# Patient Record
Sex: Male | Born: 1990 | Hispanic: Yes | Marital: Married
Health system: Southern US, Community
[De-identification: ages and names within clinical notes are randomized; demographics above are authoritative.]

## PROBLEM LIST (undated history)

## (undated) DIAGNOSIS — E119 Type 2 diabetes mellitus without complications: Secondary | ICD-10-CM

---

## 2019-09-19 ENCOUNTER — Emergency Department (HOSPITAL_COMMUNITY)
Admission: EM | Admit: 2019-09-19 | Discharge: 2019-09-19 | Disposition: A | Discharge status: Home / Self Care | Payer: Managed Care, Other (non HMO) | Attending: Emergency Medicine | Admitting: Emergency Medicine

## 2019-09-19 ENCOUNTER — Emergency Department (HOSPITAL_COMMUNITY): Payer: Managed Care, Other (non HMO)

## 2019-09-19 ENCOUNTER — Other Ambulatory Visit: Payer: Self-pay

## 2019-09-19 ENCOUNTER — Encounter (HOSPITAL_COMMUNITY): Payer: Self-pay

## 2019-09-19 DIAGNOSIS — Y9389 Activity, other specified: Secondary | ICD-10-CM | POA: Diagnosis not present

## 2019-09-19 DIAGNOSIS — W108XXA Fall (on) (from) other stairs and steps, initial encounter: Secondary | ICD-10-CM | POA: Diagnosis not present

## 2019-09-19 DIAGNOSIS — E119 Type 2 diabetes mellitus without complications: Secondary | ICD-10-CM | POA: Insufficient documentation

## 2019-09-19 DIAGNOSIS — M79632 Pain in left forearm: Secondary | ICD-10-CM | POA: Insufficient documentation

## 2019-09-19 DIAGNOSIS — R200 Anesthesia of skin: Secondary | ICD-10-CM | POA: Diagnosis not present

## 2019-09-19 DIAGNOSIS — M545 Low back pain, unspecified: Secondary | ICD-10-CM

## 2019-09-19 DIAGNOSIS — Y92038 Other place in apartment as the place of occurrence of the external cause: Secondary | ICD-10-CM | POA: Insufficient documentation

## 2019-09-19 DIAGNOSIS — Y999 Unspecified external cause status: Secondary | ICD-10-CM | POA: Insufficient documentation

## 2019-09-19 DIAGNOSIS — R202 Paresthesia of skin: Secondary | ICD-10-CM | POA: Insufficient documentation

## 2019-09-19 DIAGNOSIS — M25522 Pain in left elbow: Secondary | ICD-10-CM | POA: Diagnosis not present

## 2019-09-19 HISTORY — DX: Type 2 diabetes mellitus without complications: E11.9

## 2019-09-19 MED ORDER — METHOCARBAMOL 500 MG PO TABS: 500.0000 mg | ORAL_TABLET | Freq: Two times a day (BID) | ORAL | 0 refills | Status: AC

## 2019-09-19 MED ORDER — NAPROXEN 500 MG PO TABS: 500.0000 mg | ORAL_TABLET | Freq: Two times a day (BID) | ORAL | 0 refills | Status: AC

## 2019-09-19 MED ORDER — LIDOCAINE 5 % EX PTCH
2.0000 | MEDICATED_PATCH | CUTANEOUS | Status: DC
  Administered 2019-09-19: 2 via TRANSDERMAL
  Filled 2019-09-19: qty 2

## 2019-09-19 NOTE — Discharge Instructions (Signed)
As discussed, all of your x-rays were normal today. I would suspect you injured some of your muscles. I am sending you home with a muscle relaxer called Robaxin. This medicine can cause drowsiness, so do not drive or operate machinery while on the medication. I am also sending you home with Naproxen. You can take it twice a day as needed for pain. Do not mix with other over the counter medication. You may also buy over the counter Voltaren gel and lidoderm patches. Follow-up with you PCP if symptoms do not improve within the next week. Return to the ER for new or worsening symptoms.

## 2019-09-19 NOTE — ED Notes (Signed)
Discharge instructions and prescription discussed with Pt. Pt verbalized understanding. Pt stable and ambulatory.    

## 2019-09-19 NOTE — ED Provider Notes (Signed)
[facility] Provider Note   CSN: [id number] Arrival date & time: 09/19/19  [id number]     History No chief complaint on file.   Tony Medina is a 28 y.o. male with a past medical history significant for diabetes mellitus who presents to the ED after falling down roughly 20 stairs just prior to arrival.  Patient notes he was leaving his apartment and slipped on the icy stairs and slide on the left side of his body roughly 20 steps.  Patient notes he wrapped his arm around his head.  Patient denies head injury and loss of consciousness.  Patient is not on any blood thinners.  Patient notes after the accident he was able to ambulate at the scene and was pain-free for 15 to 30 minutes following the accident.  Patient admits to left elbow and forearm pain associated with mild numbness and tingling.  Patient also admits to left-sided low back pain.  Patient denies numbness and tingling of lower extremities, saddle paresthesias, and bowel/bladder incontinence.  Patient rates his pain a 10/10 worse with movement.  Patient has not tried anything for pain prior to arrival.  Patient denies chest pain, shortness of breath, fever, and chills  Past Medical History:  Diagnosis Date  . Diabetes mellitus without complication (HCC)     There are no problems to display for this patient.   History reviewed. No pertinent surgical history.     No family history on file.  Social History   Tobacco Use  . Smoking status: Not on file  Substance Use Topics  . Alcohol use: Not on file  . Drug use: Not on file    Home Medications Prior to Admission medications   Medication Sig Start Date End Date Taking? Authorizing Provider  methocarbamol (ROBAXIN) 500 MG tablet Take 1 tablet (500 mg total) by mouth 2 (two) times daily. 09/19/19   Mannie StabileAberman, Shantell Belongia C, PA-C  naproxen (NAPROSYN) 500 MG tablet Take 1 tablet (500 mg total) by mouth 2 (two) times daily. 09/19/19    Mannie StabileAberman, Kairah Leoni C, PA-C    Allergies    Patient has no known allergies.  Review of Systems   Review of Systems  Constitutional: Negative for chills and fever.  Eyes: Negative for visual disturbance.  Respiratory: Negative for shortness of breath.   Cardiovascular: Negative for chest pain.  Gastrointestinal: Negative for abdominal pain, diarrhea, nausea and vomiting.  Musculoskeletal: Positive for arthralgias, back pain, joint swelling and myalgias. Negative for gait problem, neck pain and neck stiffness.  Neurological: Positive for numbness. Negative for dizziness, weakness, light-headedness and headaches.    Physical Exam Updated Vital Signs BP 116/76   Pulse 95   Temp 98 F (36.7 C) (Oral)   Resp 14   SpO2 96%   Physical Exam Vitals and nursing note reviewed.  Constitutional:      General: He is not in acute distress.    Appearance: He is not ill-appearing.  HENT:     Head: Normocephalic.  Eyes:     Pupils: Pupils are equal, round, and reactive to light.  Neck:     Comments: No cervical midline tenderness. Full ROM of neck Cardiovascular:     Rate and Rhythm: Normal rate and regular rhythm.     Pulses: Normal pulses.     Heart sounds: Normal heart sounds. No murmur. No friction rub. No gallop.   Pulmonary:     Effort: Pulmonary effort is normal.     Breath  sounds: Normal breath sounds.  Abdominal:     General: Abdomen is flat. There is no distension.     Palpations: Abdomen is soft.     Tenderness: There is no abdominal tenderness. There is no guarding or rebound.  Musculoskeletal:     Cervical back: Normal range of motion and neck supple.     Comments: No T-spine midline tenderness, no stepoff or deformity, mild lumbar midline tenderness, but more significant left-sided lumbar reproducible paraspinal tenderness No leg edema bilaterally Patient moves all extremities without difficulty. DP/PT pulses 2+ and equal bilaterally Sensation grossly intact  bilaterally Strength of knee flexion and extension is 5/5 Plantar and dorsiflexion of ankle 5/5 Achilles and patellar reflexes present and equal Able to ambulate without difficulty  Left arm: tenderness to palpation over left lateral epicondyle and olecranon process with mild edema. Limited ROM due to pain. Mild tenderness over proximal portion of tibia/fibula. Soft compartments. Radial pulse intact.   Skin:    Comments: Small area of erythema over left thigh  Neurological:     General: No focal deficit present.     Mental Status: He is alert.     Comments: Speech is clear, able to follow commands CN III-XII intact Normal strength in upper and lower extremities bilaterally including dorsiflexion and plantar flexion, strong and equal grip strength Sensation grossly intact throughout Moves extremities without ataxia, coordination intact No pronator drift Ambulates without difficulty      ED Results / Procedures / Treatments   Labs (all labs ordered are listed, but only abnormal results are displayed) Labs Reviewed - No data to display  EKG None  Radiology DG Lumbar Spine Complete  Result Date: 09/19/2019 CLINICAL DATA:  Pain following fall EXAM: LUMBAR SPINE - COMPLETE 4+ VIEW COMPARISON:  None. FINDINGS: Frontal, lateral, spot lumbosacral lateral, and bilateral oblique views were obtained. There are 5 non-rib-bearing lumbar type vertebral bodies. There is no fracture or spondylolisthesis. There is mild disc space narrowing and L4-5 and L5-S1. Other disc spaces appear normal. There is an apparent Schmorl's node along the superior aspect of the L5 vertebral body. There is no appreciable facet arthropathy on the oblique views. IMPRESSION: Mild disc space narrowing at L4-5 and L5-S1. No other arthropathic change. No fracture or spondylolisthesis. Electronically Signed   By: Bretta Bang III M.D.   On: 09/19/2019 10:55   DG Elbow Complete Left  Result Date: 09/19/2019 CLINICAL  DATA:  Pain following fall EXAM: LEFT ELBOW - COMPLETE 3+ VIEW COMPARISON:  None. FINDINGS: Frontal, lateral, and bilateral oblique views were obtained. No fracture or dislocation. No joint effusion. Joint spaces appear normal. No erosive change. IMPRESSION: No fracture or dislocation.  No evident arthropathy. Electronically Signed   By: Bretta Bang III M.D.   On: 09/19/2019 10:54   DG Tibia/Fibula Left  Result Date: 09/19/2019 CLINICAL DATA:  Pain following fall EXAM: LEFT TIBIA AND FIBULA - 2 VIEW COMPARISON:  None. FINDINGS: Frontal and lateral views were obtained. No fracture or dislocation. No abnormal periosteal reaction. Joint spaces appear normal. No evident knee or ankle joint effusion. IMPRESSION: No fracture or dislocation.  No evident arthropathy. Electronically Signed   By: Bretta Bang III M.D.   On: 09/19/2019 10:53    Procedures Procedures (including critical care time)  Medications Ordered in ED Medications  lidocaine (LIDODERM) 5 % 2 patch (2 patches Transdermal Patch Applied 09/19/19 1125)    ED Course  I have reviewed the triage vital signs and the nursing notes.  Pertinent labs & imaging results that were available during my care of the patient were reviewed by me and considered in my medical decision making (see chart for details).      28 year old male presents to the ED after sliding down icy stairs just prior to arrival.  Patient admits to left elbow, left sided low back, and left forearm pain.  Vitals all within normal limits.  Patient in no acute distress and non-ill appearing.  Tenderness to palpation in left lumbar paraspinal region with mild lumbar midline tenderness.  No thoracic or cervical midline tenderness.  Tenderness palpation over left lateral epicondyle and olecranon process with overlying edema.  Limited range of motion of the elbow due to pain.  Normal left shoulder and wrist.  Patient able to ambulate in ED without difficulty.  Suspect  muscle related pain.  Will obtain x-ray to rule out bony fractures.  X-rays personally reviewed which are negative for bony fractures.  Will treat patient symptomatically with Robaxin and naproxen.  Patient advised muscle relaxer can cause drowsiness and to not drive or operate machinery while on the medication.  Patient instructed to follow-up with PCP if symptoms do not improve within the next week.  Ice therapy information given to patient discharge.  Patient also advised to buy over-the-counter Voltaren gel as needed for pain.  Strict ED precautions discussed with patient. Patient states understanding and agrees to plan. Patient discharged home in no acute distress and stable vitals  MDM Rules/Calculators/A&P                       Final Clinical Impression(s) / ED Diagnoses Final diagnoses:  Acute left-sided low back pain without sciatica  Left elbow pain    Rx / DC Orders ED Discharge Orders         Ordered    methocarbamol (ROBAXIN) 500 MG tablet  2 times daily     09/19/19 1115    naproxen (NAPROSYN) 500 MG tablet  2 times daily     09/19/19 1115           Suzy Bouchard, PA-C 09/19/19 [place], Adam, DO 09/19/19 1424

## 2019-09-19 NOTE — ED Triage Notes (Signed)
Patient fell this am on ice while coming down a step and complains of left arm , elbow and back pain. Pain with ROM

## 2021-04-30 IMAGING — DX DG ELBOW COMPLETE 3+V*L*
4 series · 4 of 4 positions shown · non-contrast
Comparison: None.

CLINICAL DATA: Pain following fall

EXAM:
LEFT ELBOW - COMPLETE 3+ VIEW

[x elbow ap left]
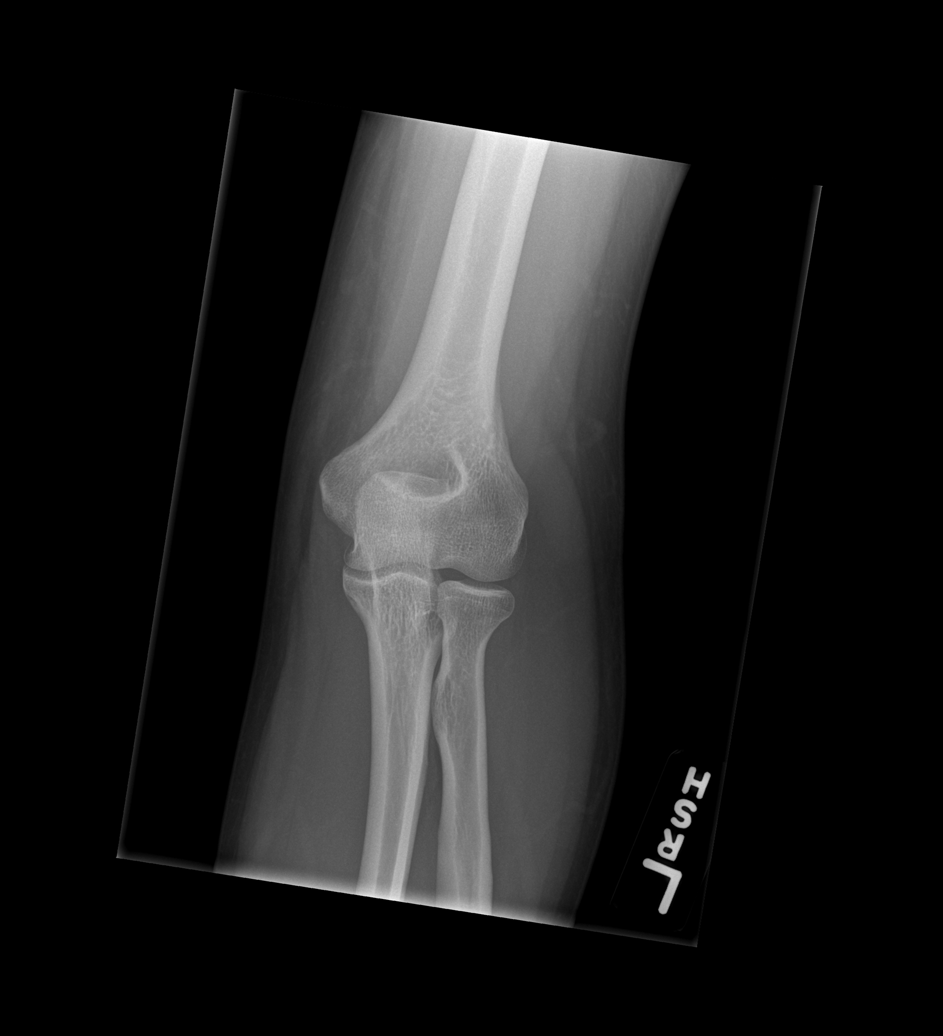

[x elbow obl left (1 of 2)]
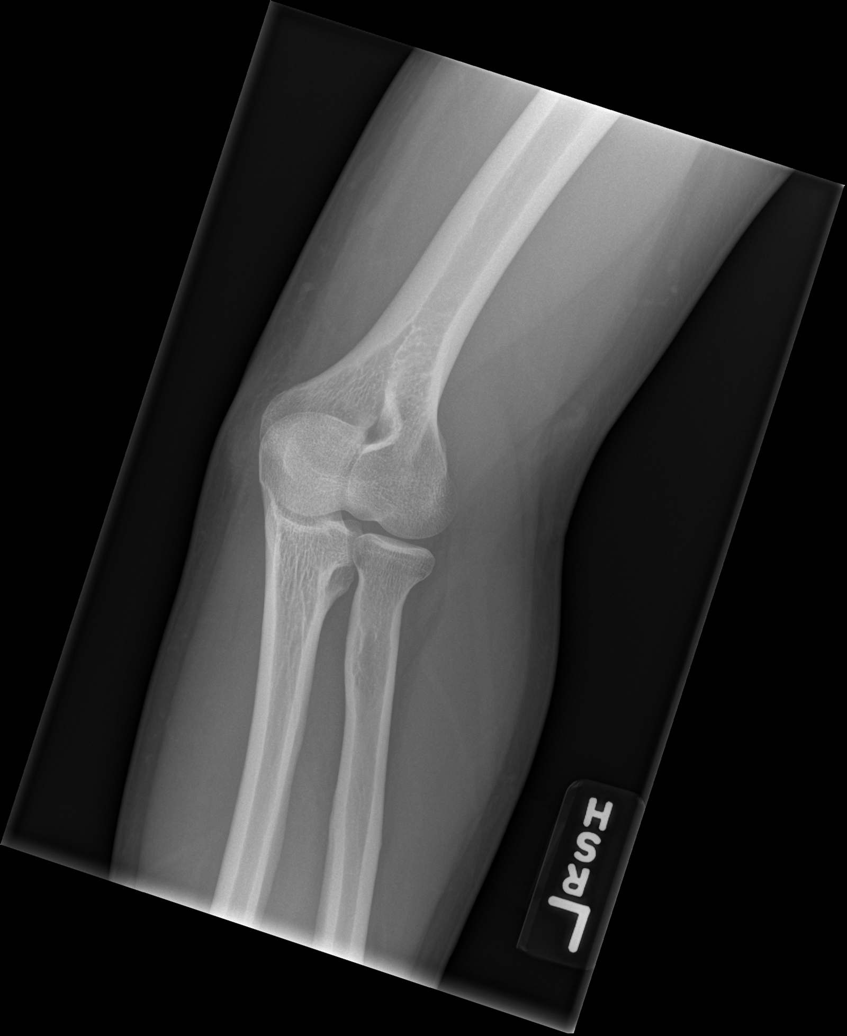

[x elbow obl left (2 of 2)]
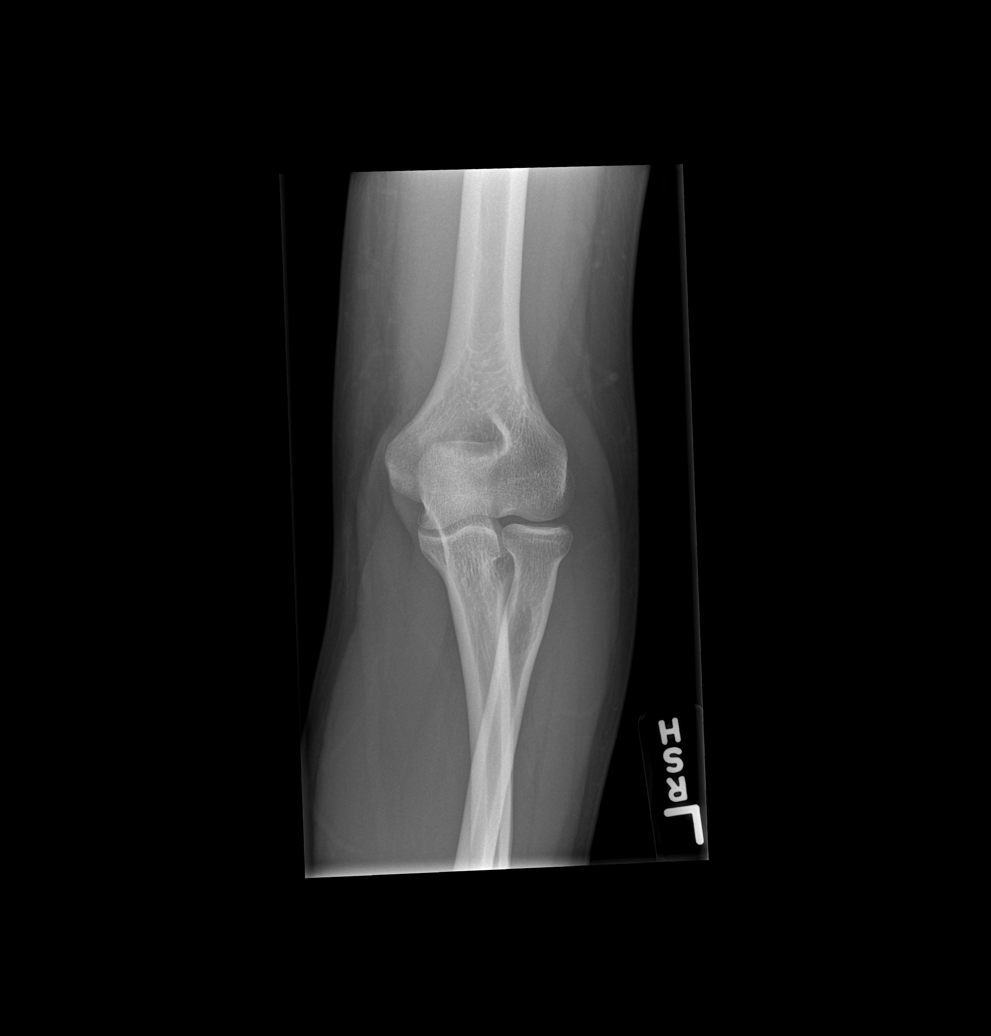

[x elbow lat left]
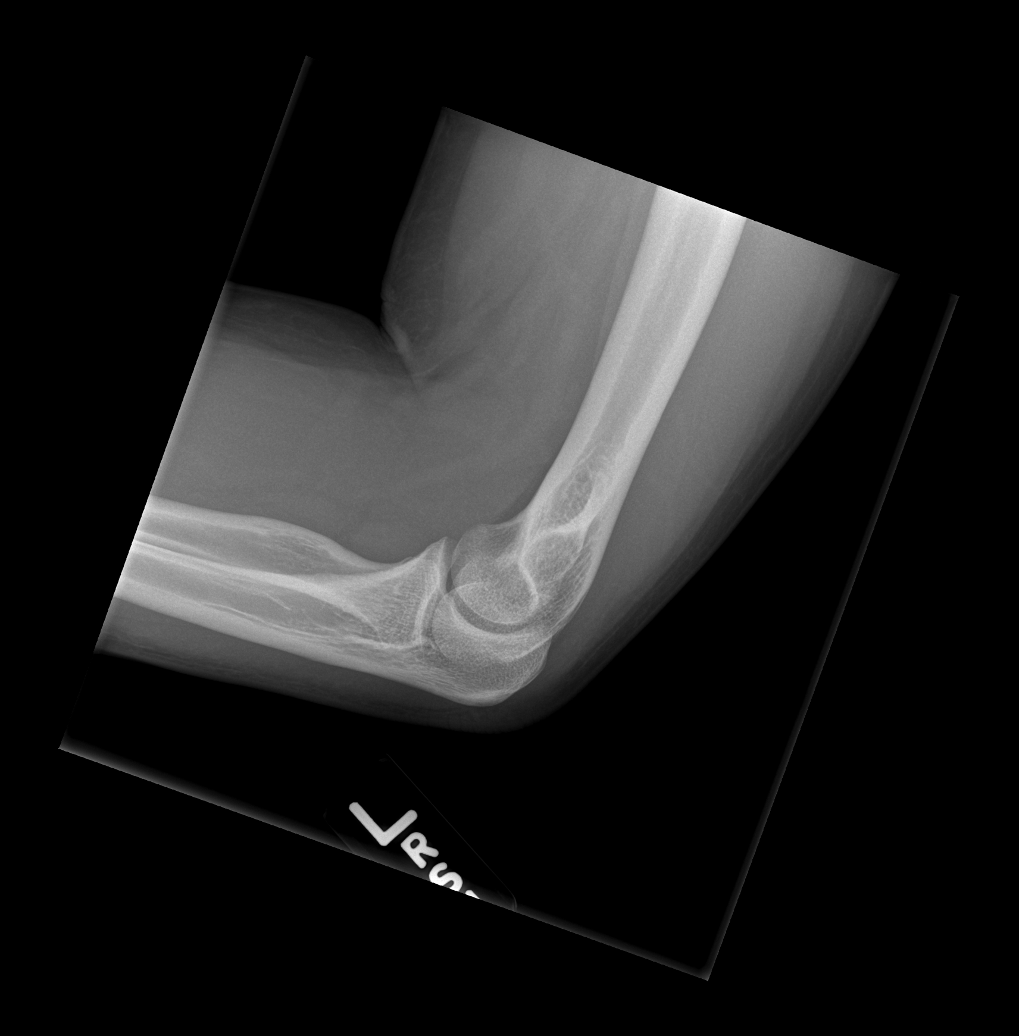

[4 of 4 positions shown; findings below may reference images not displayed]

FINDINGS: Frontal, lateral, and bilateral oblique views were obtained. No
fracture or dislocation. No joint effusion. Joint spaces appear
normal. No erosive change.
IMPRESSION: No fracture or dislocation.  No evident arthropathy.
